# Patient Record
Sex: Male | Born: 2000 | Race: White | Hispanic: No | Marital: Single | Smoking: Current every day smoker
Health system: Southern US, Community
[De-identification: ages and names within clinical notes are randomized; demographics above are authoritative.]

## PROBLEM LIST (undated history)

## (undated) DIAGNOSIS — J45909 Unspecified asthma, uncomplicated: Secondary | ICD-10-CM

## (undated) DIAGNOSIS — Z9889 Other specified postprocedural states: Secondary | ICD-10-CM

## (undated) DIAGNOSIS — J302 Other seasonal allergic rhinitis: Secondary | ICD-10-CM

## (undated) DIAGNOSIS — F419 Anxiety disorder, unspecified: Secondary | ICD-10-CM

## (undated) HISTORY — PX: TYMPANOSTOMY TUBE PLACEMENT: SHX32

## (undated) HISTORY — PX: ORTHOPEDIC SURGERY: SHX850

## (undated) HISTORY — PX: ADENOIDECTOMY: SHX5191

## (undated) HISTORY — DX: Anxiety disorder, unspecified: F41.9

---
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
## 2018-06-30 ENCOUNTER — Encounter (HOSPITAL_COMMUNITY): Payer: Self-pay | Admitting: Emergency Medicine

## 2018-06-30 ENCOUNTER — Other Ambulatory Visit: Payer: Self-pay

## 2018-06-30 ENCOUNTER — Emergency Department (HOSPITAL_COMMUNITY)
Admission: EM | Admit: 2018-06-30 | Discharge: 2018-06-30 | Disposition: A | Discharge status: Home / Self Care | Payer: HRSA Program | Attending: Emergency Medicine | Admitting: Emergency Medicine

## 2018-06-30 ENCOUNTER — Emergency Department (HOSPITAL_COMMUNITY): Payer: HRSA Program

## 2018-06-30 DIAGNOSIS — Z79899 Other long term (current) drug therapy: Secondary | ICD-10-CM | POA: Diagnosis not present

## 2018-06-30 DIAGNOSIS — F17210 Nicotine dependence, cigarettes, uncomplicated: Secondary | ICD-10-CM | POA: Diagnosis not present

## 2018-06-30 DIAGNOSIS — J45909 Unspecified asthma, uncomplicated: Secondary | ICD-10-CM | POA: Insufficient documentation

## 2018-06-30 DIAGNOSIS — Z20828 Contact with and (suspected) exposure to other viral communicable diseases: Secondary | ICD-10-CM

## 2018-06-30 DIAGNOSIS — R0602 Shortness of breath: Secondary | ICD-10-CM

## 2018-06-30 DIAGNOSIS — Z20822 Contact with and (suspected) exposure to covid-19: Secondary | ICD-10-CM

## 2018-06-30 HISTORY — DX: Unspecified asthma, uncomplicated: J45.909

## 2018-06-30 NOTE — Discharge Instructions (Addendum)
Please consider taking a daily allergy medication to help with your symptoms.  I suggest a less drowsy 24 hour medication such as allegra, zyrtec or Claritin or the generic version.  Also please start using a nasal steroid spray.   Your coronavirus test will be sent.  You can access these results by my chart in 1 to 3 days.  If your symptoms worsen, you want additional evaluation or you have other concerns please seek additional medical care and evaluation.  Please quarantine yourself at home while you have symptoms and then for at least 3 days after.

## 2018-06-30 NOTE — ED Triage Notes (Signed)
Possible covid exposure at workplace. C/O shortness of breath and cough for several days. Denies fever/chills

## 2018-06-30 NOTE — ED Provider Notes (Signed)
[facility]ENC[facility] Provider Note   CSN: [id number] Arrival date & time: 06/30/18  1505    History   Chief Complaint Chief Complaint  Patient presents with   Shortness of Breath    HPI Glenn DownsDarian Civil is a 18 y.o. male.  With a past medical history of asthma, seasonal allergies, who presents today requesting a coronavirus test.  He reports that he works at [employer] and therefore has possible coronavirus exposures.  His work was aware that he was slightly short of breath with occasional cough for the past 3 to 4 days and told him he needed to be tested.  He states that no other testing places were open so he came here.  He reports that the shortness of breath and cough he had attributed to allergies, and on its own, if work had not sent him, he would not have come in for evaluation.  He reports that he uses an albuterol inhaler at home with mild relief.  He does not take any allergy medicine.  He denies any fevers, sore throat, body aches, fatigue, rashes, changes to senses of smell or taste, close exposure to coronavirus patients.  He currently does not feel short of breath.  He does not take any hormones, no recent hemoptysis.  No chest pain or chest tightness.   He denies any leg swelling.      HPI  Past Medical History:  Diagnosis Date   Asthma    History of myringotomy    Seasonal allergies     There are no active problems to display for this patient.   Past Surgical History:  Procedure Laterality Date   ADENOIDECTOMY     ORTHOPEDIC SURGERY     TYMPANOSTOMY TUBE PLACEMENT          Home Medications    Prior to Admission medications   Medication Sig Start Date End Date Taking? Authorizing Provider  albuterol (PROVENTIL HFA;VENTOLIN HFA) 108 (90 BASE) MCG/ACT inhaler Inhale 2 puffs into the lungs every 4 (four) hours as needed. For shortness of breath    [provider]  hydrOXYzine (ATARAX/VISTARIL) 25 MG tablet Take 1  tablet (25 mg total) by mouth every 6 (six) hours as needed for itching. 08/03/15   Sherrilee GillesScoville, Brittany N, NP  ibuprofen (ADVIL,MOTRIN) 800 MG tablet Take 1 tablet (800 mg total) by mouth 3 (three) times daily. Patient not taking: Reported on 01/20/2015 12/13/14   Gilda CreasePollina, Christopher J, MD    Family History History reviewed. No pertinent family history.  Social History Social History   Tobacco Use   Smoking status: Current Every Day Smoker  Substance Use Topics   Alcohol use: No   Drug use: No     Allergies   Patient has no known allergies.   Review of Systems Review of Systems  Constitutional: Negative for chills and fever.  HENT: Positive for congestion and postnasal drip.   Respiratory: Positive for cough and shortness of breath. Negative for wheezing.   Gastrointestinal: Negative for diarrhea, nausea and vomiting.  Genitourinary: Negative for dysuria.  Musculoskeletal: Negative for back pain and myalgias.  Neurological: Negative for weakness and headaches.  All other systems reviewed and are negative.    Physical Exam Updated Vital Signs BP 122/71    Pulse 63    Temp 98.2 F (36.8 C) (Oral)    Ht 5\' 8"  (1.727 m)    Wt 99.8 kg    SpO2 97%    BMI 33.45 kg/m  Resp 16 during evaluation  Physical Exam Vitals signs and nursing note reviewed.  Constitutional:      General: He is not in acute distress.    Appearance: He is well-developed. He is not diaphoretic.  HENT:     Head: Normocephalic and atraumatic.  Eyes:     General: No scleral icterus.       Right eye: No discharge.        Left eye: No discharge.     Conjunctiva/sclera: Conjunctivae normal.  Neck:     Musculoskeletal: Normal range of motion and neck supple.  Cardiovascular:     Rate and Rhythm: Normal rate and regular rhythm.  Pulmonary:     Effort: Pulmonary effort is normal. No tachypnea, accessory muscle usage or respiratory distress.     Breath sounds: Normal breath sounds. No stridor. No  decreased breath sounds, wheezing, rhonchi or rales.  Chest:     Chest wall: No tenderness.  Abdominal:     General: There is no distension.  Musculoskeletal:        General: No deformity.  Skin:    General: Skin is warm and dry.  Neurological:     General: No focal deficit present.     Mental Status: He is alert.     Motor: No abnormal muscle tone.  Psychiatric:        Mood and Affect: Mood normal.        Behavior: Behavior normal.      ED Treatments / Results  Labs (all labs ordered are listed, but only abnormal results are displayed) Labs Reviewed  NOVEL CORONAVIRUS, NAA (HOSPITAL ORDER, SEND-OUT TO REF LAB)    EKG None  Radiology Dg Chest Port 1 View  Result Date: 06/30/2018 CLINICAL DATA:  Dyspnea EXAM: PORTABLE CHEST 1 VIEW COMPARISON:  10/22/2011 chest radiograph. FINDINGS: Stable cardiomediastinal silhouette with normal heart size. No pneumothorax. No pleural effusion. Lungs appear clear, with no acute consolidative airspace disease and no pulmonary edema. IMPRESSION: No active disease. Electronically Signed   By: Delbert Phenix M.D.   On: 06/30/2018 17:05    Procedures Procedures (including critical care time)  Medications Ordered in ED Medications - No data to display   Initial Impression / Assessment and Plan / ED Course  I have reviewed the triage vital signs and the nursing notes.  Pertinent labs & imaging results that were available during my care of the patient were reviewed by me and considered in my medical decision making (see chart for details).       Glenn Stone was evaluated in Emergency Department on 06/30/2018 for the symptoms described in the history of present illness. He was evaluated in the context of the global COVID-19 pandemic, which necessitated consideration that the patient might be at risk for infection with the SARS-CoV-2 virus that causes COVID-19. Institutional protocols and algorithms that pertain to the evaluation of patients at  risk for COVID-19 are in a state of rapid change based on information released by regulatory bodies including the CDC and federal and state organizations. These policies and algorithms were followed during the patient's care in the ED.  Patient presents today requesting coronavirus testing.  He has been short of breath for the past 3 days, which he attributes to his allergies and asthma.  He states that he only came for testing as his employer told him he needed to be tested and the shortness of breath would not of been concerning enough for him to come in.  Lungs are clear to auscultation bilaterally.  He is not tachycardic or tachypneic he is not hypoxic.  No obvious distress and generally well-appearing.  Chest x-ray was obtained without evidence of acute abnormalities.  He declined albuterol in the department.  He has adequate albuterol supply at home.  Recommended that he start taking an OTC second-generation antihistamine in addition to nasal corticosteroid spray.    COVID testing was sent as a send out lab.  He was offered additional evaluation of the shortness of breath for other causes including evaluation with labs, EKG, and additional evaluation which he declined.  Return precautions were discussed with patient who states their understanding.  At the time of discharge patient denied any unaddressed complaints or concerns.  Patient is agreeable for discharge home.    Final Clinical Impressions(s) / ED Diagnoses   Final diagnoses:  Exposure to Covid-19 Virus  SOB (shortness of breath)    ED Discharge Orders    None       Norman Clay 06/30/18 1942    Azalia Bilis, MD 07/04/18 [phone]

## 2018-07-01 LAB — NOVEL CORONAVIRUS, NAA (HOSP ORDER, SEND-OUT TO REF LAB; TAT 18-24 HRS): SARS-CoV-2, NAA: NOT DETECTED

## 2019-08-27 IMAGING — DX PORTABLE CHEST - 1 VIEW
1 series · 1 of 1 positions shown · non-contrast
Comparison: 10/22/2011 chest radiograph.

CLINICAL DATA: Dyspnea

EXAM:
PORTABLE CHEST 1 VIEW

[chest ap]
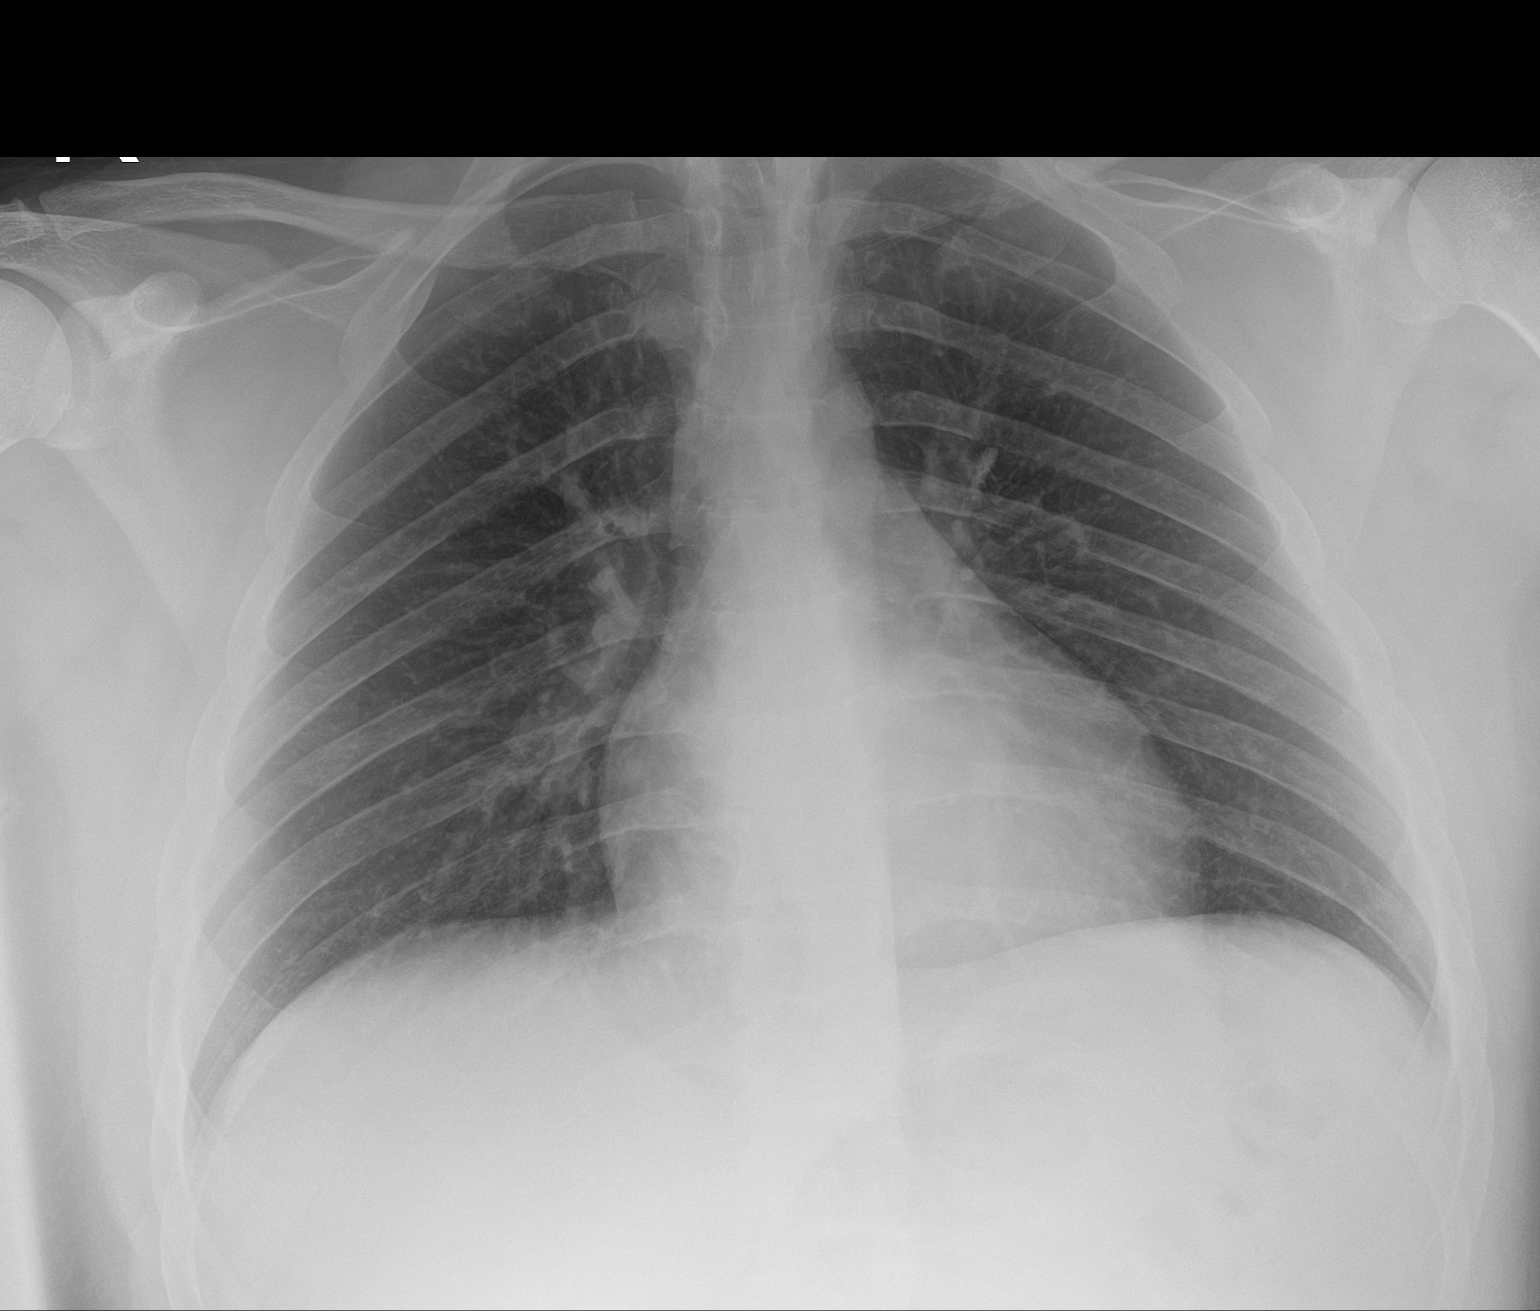

[1 of 1 positions shown; findings below may reference images not displayed]

FINDINGS: Stable cardiomediastinal silhouette with normal heart size. No
pneumothorax. No pleural effusion. Lungs appear clear, with no acute
consolidative airspace disease and no pulmonary edema.
IMPRESSION: No active disease.

## 2019-12-18 ENCOUNTER — Encounter (HOSPITAL_COMMUNITY): Payer: Self-pay | Admitting: Emergency Medicine

## 2019-12-18 ENCOUNTER — Emergency Department (HOSPITAL_COMMUNITY)
Admission: EM | Admit: 2019-12-18 | Discharge: 2019-12-18 | Disposition: A | Discharge status: Home / Self Care | Payer: Self-pay | Attending: Emergency Medicine | Admitting: Emergency Medicine

## 2019-12-18 ENCOUNTER — Other Ambulatory Visit: Payer: Self-pay

## 2019-12-18 DIAGNOSIS — J029 Acute pharyngitis, unspecified: Secondary | ICD-10-CM | POA: Insufficient documentation

## 2019-12-18 DIAGNOSIS — F17200 Nicotine dependence, unspecified, uncomplicated: Secondary | ICD-10-CM | POA: Insufficient documentation

## 2019-12-18 DIAGNOSIS — J45909 Unspecified asthma, uncomplicated: Secondary | ICD-10-CM | POA: Insufficient documentation

## 2019-12-18 LAB — GROUP A STREP BY PCR: Group A Strep by PCR: NOT DETECTED

## 2019-12-18 MED ORDER — METHYLPREDNISOLONE 4 MG PO TBPK: ORAL_TABLET | ORAL | 0 refills | Status: DC

## 2019-12-18 MED ORDER — AMOXICILLIN 500 MG PO CAPS: 500.0000 mg | ORAL_CAPSULE | Freq: Two times a day (BID) | ORAL | 0 refills | Status: AC

## 2019-12-18 NOTE — ED Triage Notes (Signed)
Pt endorses waking up with sore throat and painful to swallow.

## 2019-12-18 NOTE — Discharge Instructions (Signed)
Your strep throat test was negative.  Please follow-up with your primary care doctor in 1 week if you not improved.  I suspect you are likely suffering from a viral infection which usually resolves in 1 week's time.

## 2019-12-18 NOTE — ED Provider Notes (Signed)
[facility] EMERGENCY DEPARTMENT Provider Note   CSN: [id number] Arrival date & time: 12/18/19  1138     History Chief Complaint  Patient presents with  . Sore Throat    Glenn Stone is a 19 y.o. male with history of recurrent strep throat present emerge department sore throat.  He said he woke up this morning feeling get a painful and swollen throat.  He says this feels similar to strep throat in the past.  He thinks his last episode was in 2016, 5 years ago.  He was 19 years old at the time.  He denies any fevers or chills.  He says he has difficulty swallowing due to pain, but is not choking or drooling.  He denies any vomiting.  He denies any difficulty breathing.  He does report to me that his only medical issues that his condition he is "high iron in my blood".  He denies taking any medications and has no known drug allergies.  HPI     Past Medical History:  Diagnosis Date  . Asthma   . History of myringotomy   . Seasonal allergies     There are no problems to display for this patient.   Past Surgical History:  Procedure Laterality Date  . ADENOIDECTOMY    . ORTHOPEDIC SURGERY    . TYMPANOSTOMY TUBE PLACEMENT         No family history on file.  Social History   Tobacco Use  . Smoking status: Current Every Day Smoker  Substance Use Topics  . Alcohol use: No  . Drug use: No    Home Medications Prior to Admission medications   Medication Sig Start Date End Date Taking? Authorizing Provider  albuterol (PROVENTIL HFA;VENTOLIN HFA) 108 (90 BASE) MCG/ACT inhaler Inhale 2 puffs into the lungs every 4 (four) hours as needed. For shortness of breath    [provider]  amoxicillin (AMOXIL) 500 MG capsule Take 1 capsule (500 mg total) by mouth 2 (two) times daily for 7 days. 12/18/19 12/25/19  Terald Sleeper, MD  hydrOXYzine (ATARAX/VISTARIL) 25 MG tablet Take 1 tablet (25 mg total) by mouth every 6 (six) hours as needed for  itching. 08/03/15   Sherrilee Gilles, NP  ibuprofen (ADVIL,MOTRIN) 800 MG tablet Take 1 tablet (800 mg total) by mouth 3 (three) times daily. Patient not taking: Reported on 01/20/2015 12/13/14   Gilda Crease, MD  methylPREDNISolone (MEDROL DOSEPAK) 4 MG TBPK tablet Use as directed 12/18/19   Terald Sleeper, MD    Allergies    Patient has no known allergies.  Review of Systems   Review of Systems  Constitutional: Negative for chills and fever.  HENT: Positive for sore throat, trouble swallowing and voice change.   Respiratory: Negative for cough and shortness of breath.   Cardiovascular: Negative for chest pain and palpitations.  Gastrointestinal: Negative for abdominal pain and vomiting.  Skin: Negative for color change and rash.  Neurological: Negative for syncope and headaches.  Psychiatric/Behavioral: Negative for agitation and confusion.  All other systems reviewed and are negative.   Physical Exam Updated Vital Signs BP 128/74 (BP Location: Left Arm)   Pulse 62   Temp 98.4 F (36.9 C) (Oral)   Resp 16   Ht 5\' 8"  (1.727 m)   Wt 97.5 kg   SpO2 97%   BMI 32.69 kg/m   Physical Exam Vitals and nursing note reviewed.  Constitutional:      Appearance: He is  well-developed.  HENT:     Head: Normocephalic and atraumatic.     Mouth/Throat:     Mouth: Mucous membranes are moist. No oral lesions.     Pharynx: Uvula midline. No pharyngeal swelling, oropharyngeal exudate, posterior oropharyngeal erythema or uvula swelling.  Eyes:     Conjunctiva/sclera: Conjunctivae normal.  Cardiovascular:     Rate and Rhythm: Normal rate and regular rhythm.     Heart sounds: Normal heart sounds.  Pulmonary:     Effort: Pulmonary effort is normal. No respiratory distress.  Musculoskeletal:     Cervical back: Neck supple.  Skin:    General: Skin is warm and dry.     Comments: Skin has ruddy appearance diffusely  Neurological:     General: No focal deficit present.      Mental Status: He is alert and oriented to person, place, and time.  Psychiatric:        Mood and Affect: Mood normal.        Behavior: Behavior normal.     ED Results / Procedures / Treatments   Labs (all labs ordered are listed, but only abnormal results are displayed) Labs Reviewed  GROUP A STREP BY PCR    EKG None  Radiology No results found.  Procedures Procedures (including critical care time)  Medications Ordered in ED Medications - No data to display  ED Course  I have reviewed the triage vital signs and the nursing notes.  Pertinent labs & imaging results that were available during my care of the patient were reviewed by me and considered in my medical decision making (see chart for details).  This 19-year male present emerge department complaint of sore throat beginning today.  He is afebrile nontoxic-appearing.  Have a very low suspicion for sepsis, deep space neck infection, Ludwick's angina.  He does not have any obvious exudates on his tonsils.  However with his history of recurrent strep, we can test him again today.  This may be very early strep throat.  He is Covid vaccinated.  We can discuss a watch and wait prescription for antibiotics and possibly short course of steroids.  Advised Tylenol and ibuprofen at home.  *  Strep test negative.   Okay for discharge     Final Clinical Impression(s) / ED Diagnoses Final diagnoses:  Sore throat    Rx / DC Orders ED Discharge Orders         Ordered    methylPREDNISolone (MEDROL DOSEPAK) 4 MG TBPK tablet        12/18/19 1350    amoxicillin (AMOXIL) 500 MG capsule  2 times daily        12/18/19 1350           Terald Sleeper, MD 12/18/19 [phone]

## 2020-01-13 ENCOUNTER — Telehealth: Payer: Self-pay | Admitting: *Deleted

## 2020-01-13 NOTE — Telephone Encounter (Signed)
Pharmacy called related to Rx written 11/18 .Marland KitchenMarland KitchenEDCM clarified with EDP to change Rx to: discard Rx.  Pt will need to be seen again if still having symptoms.

## 2020-04-09 ENCOUNTER — Ambulatory Visit: Payer: Self-pay | Attending: Internal Medicine

## 2020-04-09 DIAGNOSIS — Z23 Encounter for immunization: Secondary | ICD-10-CM

## 2020-04-09 NOTE — Progress Notes (Signed)
   Covid-19 Vaccination Clinic  Name:  Ark Agrusa    MRN: [id number] DOB: [birth year]  04/09/2020  Mr. Lybrand was observed post Covid-19 immunization for 15 minutes without incident. He was provided with Vaccine Information Sheet and instruction to access the V-Safe system.   Mr. Robarts was instructed to call 911 with any severe reactions post vaccine: Marland Kitchen Difficulty breathing  . Swelling of face and throat  . A fast heartbeat  . A bad rash all over body  . Dizziness and weakness   Immunizations Administered    Name Date Dose VIS Date Route   PFIZER Comrnaty(Gray TOP) Covid-19 Vaccine 04/09/2020  2:08 PM 0.3 mL 01/08/2020 Intramuscular   Manufacturer: [employer], [employer]   Lot: [id number]   NDC: [phone]

## 2021-01-13 ENCOUNTER — Other Ambulatory Visit: Payer: Self-pay

## 2021-01-13 ENCOUNTER — Ambulatory Visit
Admission: RE | Admit: 2021-01-13 | Discharge status: Against Medical Advice | Payer: Self-pay | Source: Ambulatory Visit

## 2021-01-13 ENCOUNTER — Ambulatory Visit: Admission: EM | Admit: 2021-01-13 | Discharge: 2021-01-13 | Discharge status: Against Medical Advice | Payer: Self-pay
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
## 2021-04-19 ENCOUNTER — Other Ambulatory Visit: Payer: Self-pay

## 2021-04-19 ENCOUNTER — Ambulatory Visit (INDEPENDENT_AMBULATORY_CARE_PROVIDER_SITE_OTHER): Payer: BC Managed Care – PPO | Admitting: Family

## 2021-04-19 ENCOUNTER — Encounter: Payer: Self-pay | Admitting: Family

## 2021-04-19 VITALS — BP 138/86 | HR 81 | Temp 98.3°F | Resp 18 | Ht 68.82 in | Wt 230.2 lb

## 2021-04-19 DIAGNOSIS — Z1329 Encounter for screening for other suspected endocrine disorder: Secondary | ICD-10-CM

## 2021-04-19 DIAGNOSIS — Z114 Encounter for screening for human immunodeficiency virus [HIV]: Secondary | ICD-10-CM | POA: Diagnosis not present

## 2021-04-19 DIAGNOSIS — Z0001 Encounter for general adult medical examination with abnormal findings: Secondary | ICD-10-CM

## 2021-04-19 DIAGNOSIS — F32A Depression, unspecified: Secondary | ICD-10-CM

## 2021-04-19 DIAGNOSIS — R21 Rash and other nonspecific skin eruption: Secondary | ICD-10-CM | POA: Diagnosis not present

## 2021-04-19 DIAGNOSIS — F419 Anxiety disorder, unspecified: Secondary | ICD-10-CM | POA: Diagnosis not present

## 2021-04-19 DIAGNOSIS — Z1159 Encounter for screening for other viral diseases: Secondary | ICD-10-CM

## 2021-04-19 DIAGNOSIS — Z7689 Persons encountering health services in other specified circumstances: Secondary | ICD-10-CM

## 2021-04-19 DIAGNOSIS — Z130 Encounter for screening for diseases of the blood and blood-forming organs and certain disorders involving the immune mechanism: Secondary | ICD-10-CM

## 2021-04-19 DIAGNOSIS — Z13228 Encounter for screening for other metabolic disorders: Secondary | ICD-10-CM

## 2021-04-19 DIAGNOSIS — Z0000 Encounter for general adult medical examination without abnormal findings: Secondary | ICD-10-CM

## 2021-04-19 DIAGNOSIS — Z131 Encounter for screening for diabetes mellitus: Secondary | ICD-10-CM | POA: Diagnosis not present

## 2021-04-19 DIAGNOSIS — Z13220 Encounter for screening for lipoid disorders: Secondary | ICD-10-CM

## 2021-04-19 MED ORDER — TRIAMCINOLONE ACETONIDE 0.5 % EX OINT: 1.0000 "application " | TOPICAL_OINTMENT | Freq: Two times a day (BID) | CUTANEOUS | 1 refills | Status: AC

## 2021-04-19 NOTE — Progress Notes (Signed)
Pt presents to establish care, pt states he is concerned about health due to mother just being diagnosed with diabetes, and have family hx of heart disease  ?Pt having some anxiety issues and would like to be referred to psychiatry for mental health issues  ?

## 2021-04-19 NOTE — Patient Instructions (Signed)
Thank you for choosing Primary Care at [facility] for your medical home!   ? ?Glenn Stone was seen by Rema Fendt, NP today.  ? ?Glenn Stone's primary care provider is Ricky Stabs, NP.   ?For the best care possible,  you should try to see Ricky Stabs, NP ?whenever you come to office.  ? ?We look forward to seeing you again soon! ? ?If you have any questions about your visit today,  ?please call us at [phone] ? ?Or feel free to reach your provider via MyChart.   ?Keeping you healthy ?  ?Get these tests ?Blood pressure- Have your blood pressure checked once a year by your healthcare provider.  Normal blood pressure is 120/80. ?Weight- Have your body mass index (BMI) calculated to screen for obesity.  BMI is a measure of body fat based on height and weight. You can also calculate your own BMI at https://www.west-esparza.com/. ?Cholesterol- Have your cholesterol checked regularly starting at age 76, sooner may be necessary if you have diabetes, high blood pressure, if a family member developed heart diseases at an early age or if you smoke.  ?Chlamydia, HIV, and other sexual transmitted disease- Get screened each year until the age of 72 then within three months of each new sexual partner. ?Diabetes- Have your blood sugar checked regularly if you have high blood pressure, high cholesterol, a family history of diabetes or if you are overweight. ?  ?Get these vaccines ?Flu shot- Every fall. ?Tetanus shot- Every 10 years. ?Menactra- Single dose; prevents meningitis. ?  ?Take these steps ?Don't smoke- If you do smoke, ask your healthcare provider about quitting. For tips on how to quit, go to www.smokefree.gov or call 1-800-QUIT-NOW. ?Be physically active- Exercise 5 days a week for at least 30 minutes.  If you are not already physically active start slow and gradually work up to 30 minutes of moderate physical activity.  Examples of moderate activity include walking briskly, mowing the yard, dancing, swimming  bicycling, etc. ?Eat a healthy diet- Eat a variety of healthy foods such as fruits, vegetables, low fat milk, low fat cheese, yogurt, lean meats, poultry, fish, beans, tofu, etc.  For more information on healthy eating, go to www.thenutritionsource.org ?Drink alcohol in moderation- Limit alcohol intake two drinks or less a day.  Never drink and drive. ?Dentist- Brush and floss teeth twice daily; visit your dentis twice a year. ?Depression-Your emotional health is as important as your physical health.  If you're feeling down, losing interest in things you normally enjoy please talk with your healthcare provider. ?Arboriculturist- If you keep a gun in your home, keep it unloaded and with the safety lock on.  Bullets should be stored separately. ?Helmet use- Always wear a helmet when riding a motorcycle, bicycle, rollerblading or skateboarding. ?Safe sex- If you may be exposed to a sexually transmitted infection, use a condom ?Seat belts- Seat bels can save your life; always wear one. ?Smoke/Carbon Monoxide detectors- These detectors need to be installed on the appropriate level of your home.  Replace batteries at least once a year. ?Skin Cancer- When out in the sun, cover up and use sunscreen SPF 15 or higher. ?Violence- If anyone is threatening or hurting you, please tell your healthcare provider. ? ?

## 2021-04-20 ENCOUNTER — Other Ambulatory Visit: Payer: Self-pay | Admitting: Family

## 2021-04-20 DIAGNOSIS — Z13228 Encounter for screening for other metabolic disorders: Secondary | ICD-10-CM

## 2021-04-20 LAB — CMP14+EGFR
ALT: 52 IU/L — ABNORMAL HIGH (ref 0–44)
AST: 33 IU/L (ref 0–40)
Albumin/Globulin Ratio: 1.7 (ref 1.2–2.2)
Albumin: 4.6 g/dL (ref 4.1–5.2)
Alkaline Phosphatase: 82 IU/L (ref 44–121)
BUN/Creatinine Ratio: 8 — ABNORMAL LOW (ref 9–20)
BUN: 9 mg/dL (ref 6–20)
Bilirubin Total: 0.5 mg/dL (ref 0.0–1.2)
CO2: 19 mmol/L — ABNORMAL LOW (ref 20–29)
Calcium: 9.5 mg/dL (ref 8.7–10.2)
Chloride: 104 mmol/L (ref 96–106)
Creatinine, Ser: 1.12 mg/dL (ref 0.76–1.27)
Globulin, Total: 2.7 g/dL (ref 1.5–4.5)
Glucose: 88 mg/dL (ref 70–99)
Potassium: 4.7 mmol/L (ref 3.5–5.2)
Sodium: 141 mmol/L (ref 134–144)
Total Protein: 7.3 g/dL (ref 6.0–8.5)
eGFR: 95 mL/min/{1.73_m2} (ref >59)

## 2021-04-20 LAB — LIPID PANEL
Chol/HDL Ratio: 4.7 ratio (ref 0.0–5.0)
Cholesterol, Total: 175 mg/dL (ref 100–199)
HDL: 37 mg/dL — ABNORMAL LOW (ref >39)
LDL Chol Calc (NIH): 107 mg/dL — ABNORMAL HIGH (ref 0–99)
Triglycerides: 174 mg/dL — ABNORMAL HIGH (ref 0–149)
VLDL Cholesterol Cal: 31 mg/dL (ref 5–40)

## 2021-04-20 LAB — CBC
Hematocrit: 51.5 % — ABNORMAL HIGH (ref 37.5–51.0)
Hemoglobin: 18.3 g/dL — ABNORMAL HIGH (ref 13.0–17.7)
MCH: 31.3 pg (ref 26.6–33.0)
MCHC: 35.5 g/dL (ref 31.5–35.7)
MCV: 88 fL (ref 79–97)
Platelets: 260 10*3/uL (ref 150–450)
RBC: 5.84 x10E6/uL — ABNORMAL HIGH (ref 4.14–5.80)
RDW: 12.3 % (ref 11.6–15.4)
WBC: 7.3 10*3/uL (ref 3.4–10.8)

## 2021-04-20 LAB — HEMOGLOBIN A1C
Est. average glucose Bld gHb Est-mCnc: 97 mg/dL
Hgb A1c MFr Bld: 5 % (ref 4.8–5.6)

## 2021-04-20 LAB — HIV ANTIBODY (ROUTINE TESTING W REFLEX): HIV Screen 4th Generation wRfx: NONREACTIVE

## 2021-04-20 LAB — TSH: TSH: 2.46 u[IU]/mL (ref 0.450–4.500)

## 2021-04-20 LAB — HEPATITIS C ANTIBODY: Hep C Virus Ab: NONREACTIVE

## 2021-04-20 NOTE — Progress Notes (Signed)
-    Kidney function normal.  ? ?-  Thyroid function normal.  ? ?-  No diabetes.  ? ?-  Hepatitis C negative.  ? ?-  HIV negative.  ? ?The following abnormalities are noted:   ?-  ALT used to check liver function mildly higher than normal. Some causes of an elevation of ALT may be increased alcohol consumption, high-fat diet, or overuse of NSAID's such as Ibuprofen, Aleve, Motrin, and Naproxen. Consider limiting use if this applies.  ?-  Cholesterol higher than expected. High cholesterol may increase risk of heart attack and/or stroke. Consider eating more fruits, vegetables, and lean baked meats such as chicken or fish. Moderate intensity exercise at least 150 minutes as tolerated per week may help as well.  ? ?All other values are normal, stable or within acceptable limits. ? ?Medication changes / Follow up labs / Other changes or recommendations:   ?-  Recheck liver function in 4 to 6 weeks.  ?-  Recheck fasting cholesterol routinely. No medication needed as of present. ? ?Glenn Herter, NP 04/20/2021 11:00 AM  ?

## 2021-07-15 ENCOUNTER — Encounter (HOSPITAL_COMMUNITY): Payer: Self-pay | Admitting: Psychiatry

## 2021-07-15 ENCOUNTER — Telehealth (INDEPENDENT_AMBULATORY_CARE_PROVIDER_SITE_OTHER): Payer: BC Managed Care – PPO | Admitting: Psychiatry

## 2021-07-15 DIAGNOSIS — F411 Generalized anxiety disorder: Secondary | ICD-10-CM

## 2021-07-15 DIAGNOSIS — F4010 Social phobia, unspecified: Secondary | ICD-10-CM | POA: Diagnosis not present

## 2021-07-15 DIAGNOSIS — F330 Major depressive disorder, recurrent, mild: Secondary | ICD-10-CM | POA: Diagnosis not present

## 2021-07-15 MED ORDER — CITALOPRAM HYDROBROMIDE 10 MG PO TABS: 10.0000 mg | ORAL_TABLET | Freq: Every day | ORAL | 0 refills | Status: AC
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
## 2021-08-25 ENCOUNTER — Encounter (HOSPITAL_COMMUNITY): Payer: Self-pay

## 2021-08-25 ENCOUNTER — Telehealth (HOSPITAL_COMMUNITY): Payer: BC Managed Care – PPO | Admitting: Psychiatry

## 2021-11-21 ENCOUNTER — Encounter: Payer: Self-pay | Admitting: Family

## 2021-11-22 ENCOUNTER — Other Ambulatory Visit: Payer: Self-pay | Admitting: Family

## 2021-11-22 DIAGNOSIS — J3089 Other allergic rhinitis: Secondary | ICD-10-CM

## 2021-11-22 MED ORDER — ALBUTEROL SULFATE HFA 108 (90 BASE) MCG/ACT IN AERS: 1.0000 | INHALATION_SPRAY | Freq: Four times a day (QID) | RESPIRATORY_TRACT | 1 refills | Status: AC | PRN

## 2021-11-22 NOTE — Telephone Encounter (Signed)
Order complete. 

## 2021-11-23 ENCOUNTER — Other Ambulatory Visit: Payer: Self-pay | Admitting: Family

## 2021-11-23 DIAGNOSIS — J3089 Other allergic rhinitis: Secondary | ICD-10-CM
